# Patient Record
Sex: Female | Born: 1940 | Race: White | Hispanic: No | Marital: Married | Smoking: Former smoker
Health system: Southern US, Community
[De-identification: ages and names within clinical notes are randomized; demographics above are authoritative.]

## PROBLEM LIST (undated history)

## (undated) DIAGNOSIS — I6529 Occlusion and stenosis of unspecified carotid artery: Secondary | ICD-10-CM

## (undated) DIAGNOSIS — M1990 Unspecified osteoarthritis, unspecified site: Secondary | ICD-10-CM

## (undated) DIAGNOSIS — E785 Hyperlipidemia, unspecified: Secondary | ICD-10-CM

## (undated) DIAGNOSIS — I10 Essential (primary) hypertension: Secondary | ICD-10-CM

## (undated) HISTORY — DX: Occlusion and stenosis of unspecified carotid artery: I65.29

## (undated) HISTORY — DX: Unspecified osteoarthritis, unspecified site: M19.90

## (undated) HISTORY — DX: Hyperlipidemia, unspecified: E78.5

## (undated) HISTORY — DX: Essential (primary) hypertension: I10

---

## 2000-08-01 ENCOUNTER — Other Ambulatory Visit: Admission: RE | Admit: 2000-08-01 | Discharge: 2000-08-01 | Payer: Self-pay

## 2000-08-01 ENCOUNTER — Encounter: Admission: RE | Admit: 2000-08-01 | Discharge: 2000-08-01 | Payer: Self-pay

## 2006-12-09 ENCOUNTER — Ambulatory Visit: Payer: Self-pay | Admitting: Gastroenterology

## 2006-12-24 ENCOUNTER — Ambulatory Visit: Payer: Self-pay | Admitting: Gastroenterology

## 2006-12-24 ENCOUNTER — Encounter: Payer: Self-pay | Admitting: Gastroenterology

## 2008-06-24 ENCOUNTER — Ambulatory Visit: Payer: Self-pay | Admitting: Cardiovascular Disease

## 2008-07-05 ENCOUNTER — Ambulatory Visit: Payer: Self-pay

## 2008-07-05 ENCOUNTER — Encounter: Payer: Self-pay | Admitting: Cardiovascular Disease

## 2008-07-26 ENCOUNTER — Encounter: Payer: Self-pay | Admitting: Cardiovascular Disease

## 2008-07-26 ENCOUNTER — Ambulatory Visit: Payer: Self-pay | Admitting: Cardiovascular Disease

## 2008-07-26 DIAGNOSIS — E785 Hyperlipidemia, unspecified: Secondary | ICD-10-CM | POA: Insufficient documentation

## 2008-07-26 DIAGNOSIS — I10 Essential (primary) hypertension: Secondary | ICD-10-CM | POA: Insufficient documentation

## 2008-07-26 DIAGNOSIS — M1990 Unspecified osteoarthritis, unspecified site: Secondary | ICD-10-CM | POA: Insufficient documentation

## 2008-07-26 DIAGNOSIS — I6529 Occlusion and stenosis of unspecified carotid artery: Secondary | ICD-10-CM

## 2009-01-05 ENCOUNTER — Encounter: Payer: Self-pay | Admitting: Cardiovascular Disease

## 2009-01-05 ENCOUNTER — Ambulatory Visit: Payer: Self-pay

## 2009-03-01 ENCOUNTER — Encounter: Admission: RE | Admit: 2009-03-01 | Discharge: 2009-03-01 | Payer: Self-pay | Admitting: General Surgery

## 2009-03-01 ENCOUNTER — Encounter (INDEPENDENT_AMBULATORY_CARE_PROVIDER_SITE_OTHER): Payer: Self-pay | Admitting: Interventional Radiology

## 2009-03-01 ENCOUNTER — Other Ambulatory Visit: Admission: RE | Admit: 2009-03-01 | Discharge: 2009-03-01 | Payer: Self-pay | Admitting: Interventional Radiology

## 2009-03-01 IMAGING — US US BIOPSY
1 series · 11 of 11 positions shown · non-contrast
Comparison: Prior ultrasound at [facility] dated
[DATE]

CLINICAL DATA: Left thyroid nodules.

ULTRASOUND-GUIDED NEEDLE ASPIRATE BIOPSY, LEFT LOBE OF THYROID
The above procedure was discussed with the patient and written
informed consent was obtained.

[Series 1: us biopsy · 11 acquisitions, 11 frames shown]
[im 1/11]
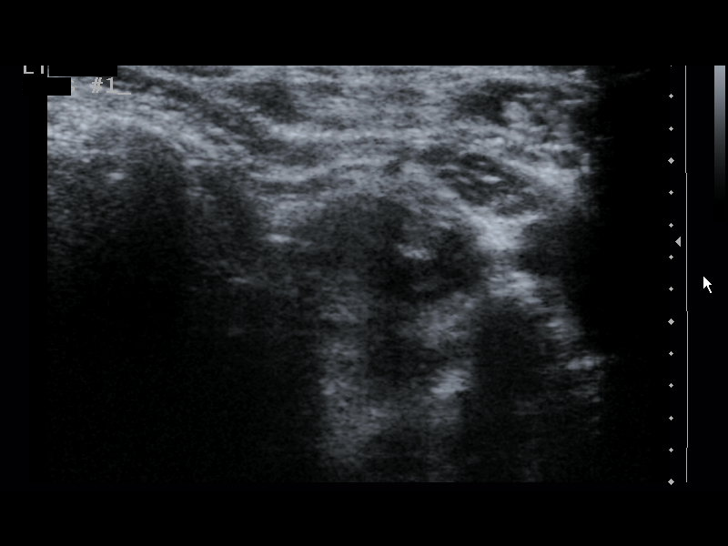
[im 2/11]
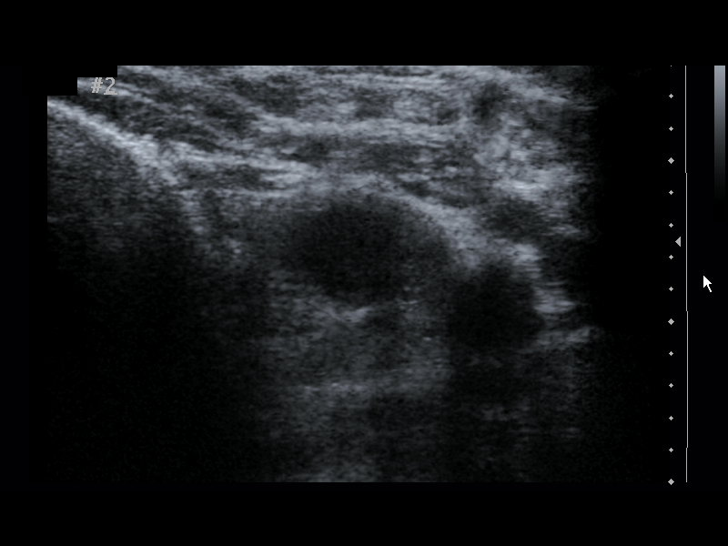
[im 3/11]
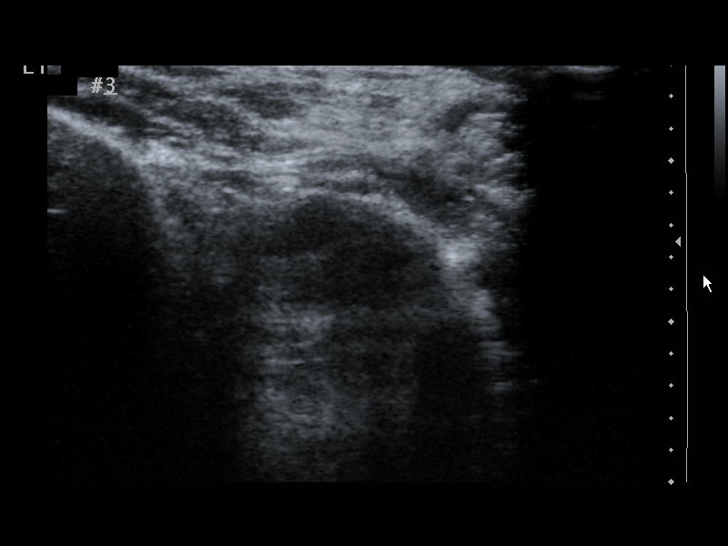
[im 4/11]
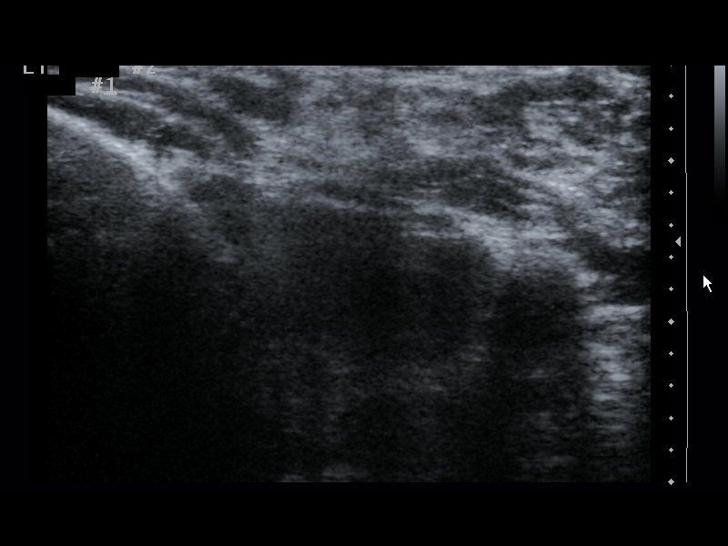
[im 5/11]
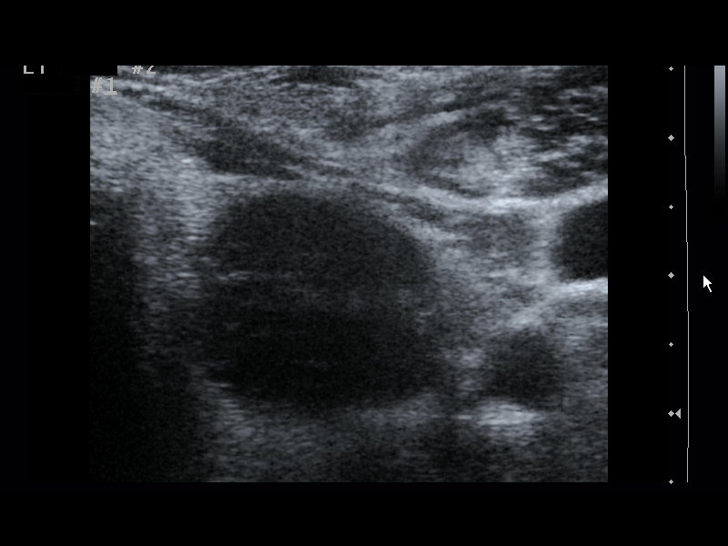
[im 6/11]
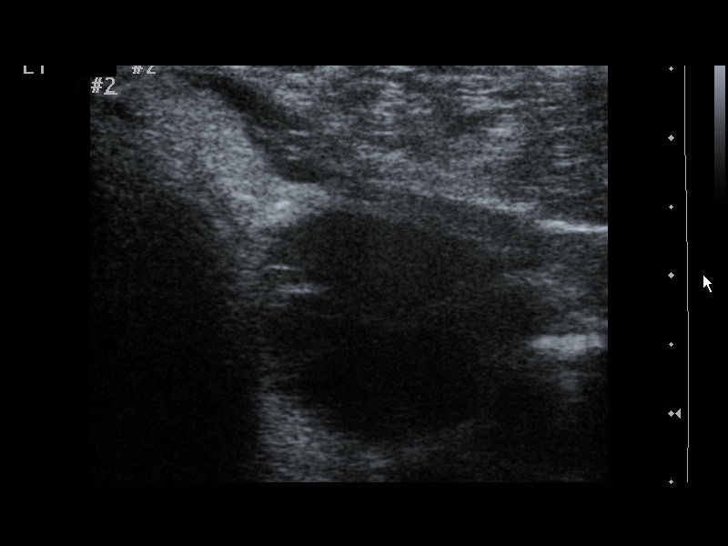
[im 7/11]
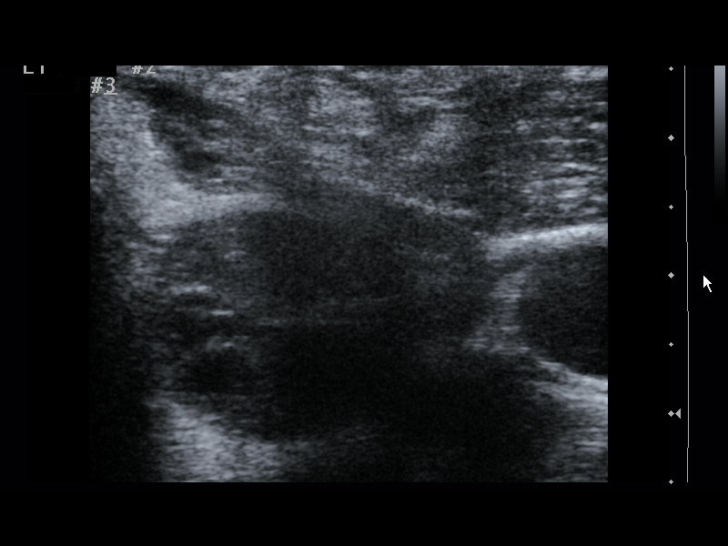
[im 8/11]
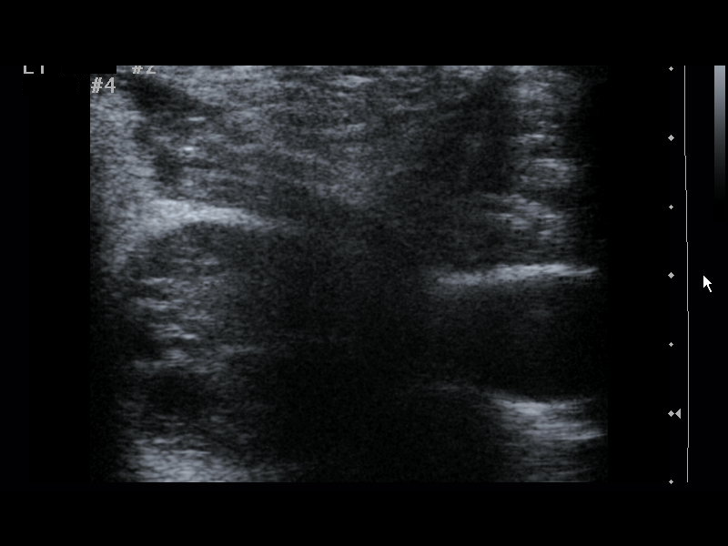
[im 9/11]
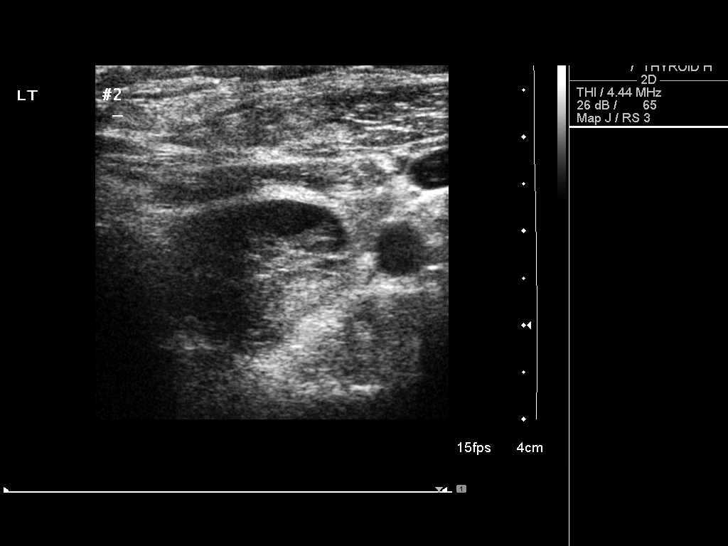
[im 10/11]
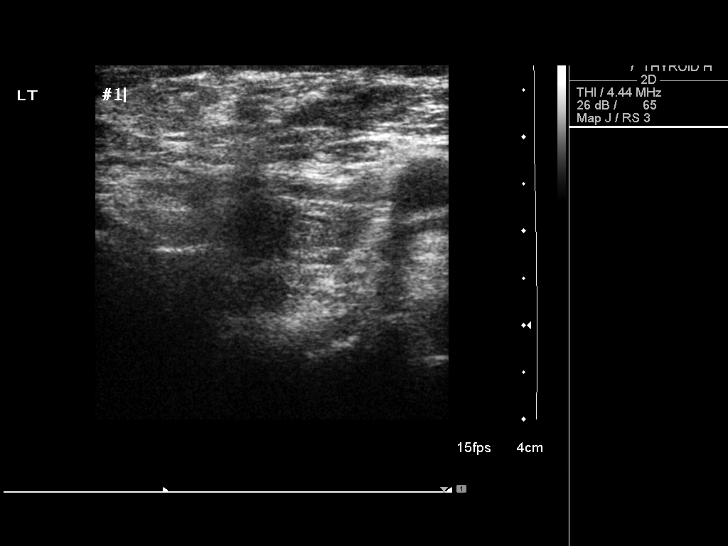
[im 11/11]
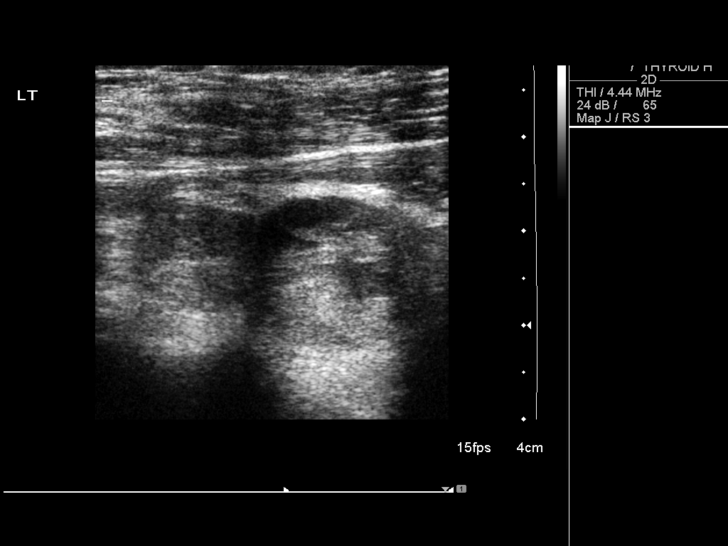

[11 of 11 positions shown; findings below may reference images not displayed]

FINDINGS: Ultrasound was performed to localize and mark an adequate
site for the biopsy.  The patient was then prepped and draped in a
normal sterile fashion.  Local anesthesia was provided with 1%
lidocaine.

Using direct ultrasound guidance, 4 passes were made using 25 gauge
needles into the nodule within the superior nodule in the left lobe
of the thyroid.

Additional biopsy was performed of the larger more inferior thyroid
nodule.  A total of four passes were made utilizing 25 gauge
needles under direct ultrasound guidance.

Ultrasound was used to confirm needle placements on all occasions.
Specimens were sent to Pathology for analysis.  Post procedural
imaging demonstrated no hematoma or immediate complication.  The
patient tolerated the procedure well.
IMPRESSION: Ultrasound guided needle aspirate biopsy of both nodules in the
left lobe of the thyroid.  These were clearly labeled as nodule
number one in the superior left lobe and nodule number two
immediately inferior to the superior nodule.

## 2009-03-06 ENCOUNTER — Ambulatory Visit: Payer: Self-pay | Admitting: Cardiovascular Disease

## 2009-07-04 ENCOUNTER — Encounter: Payer: Self-pay | Admitting: Cardiovascular Disease

## 2009-07-05 ENCOUNTER — Encounter: Payer: Self-pay | Admitting: Cardiovascular Disease

## 2009-07-05 ENCOUNTER — Ambulatory Visit: Payer: Self-pay

## 2010-01-17 ENCOUNTER — Encounter: Payer: Self-pay | Admitting: Cardiovascular Disease

## 2010-01-18 ENCOUNTER — Encounter: Payer: Self-pay | Admitting: Cardiovascular Disease

## 2010-01-18 ENCOUNTER — Ambulatory Visit: Payer: Self-pay

## 2010-06-22 ENCOUNTER — Encounter (INDEPENDENT_AMBULATORY_CARE_PROVIDER_SITE_OTHER): Payer: Self-pay | Admitting: *Deleted

## 2010-06-26 NOTE — Miscellaneous (Signed)
Summary: Orders Update  Clinical Lists Changes  Orders: Added new Test order of Carotid Duplex (Carotid Duplex) - Signed 

## 2010-06-28 NOTE — Letter (Signed)
Summary: Generic Letter  Architectural technologist, Main Office  [address]   [place], [place] [address]   Phone: [phone]  Fax: [phone]        June 22, 2010 MRN: [id number]    Joanne Montes [address] [place], [place]  [address]    Dear Ms. Wedin,    This letter is to inform you that we have schedule you for an appointment for a 6 month follow-up carotid study to be done on the same day as your appointment with Dr Excell Seltzer. We have also tried to contact you by phone with no success. All phone numbers in our system have been disconnected. Please call our office to update your phone numbers.3   The time and date for this test is listed as below.   Carotid ultrasound 3/29 @ 11:00 a.m Dr Copper 3/29 @ 12:00.   If you have any questions concerning this appointment please feel free to call our office at [phone] and press option 7 to speak with a memeber of the scheduling team.        Sincerely,  Joanne Montes  This letter has been electronically signed by your physician.

## 2010-08-11 ENCOUNTER — Encounter: Payer: Self-pay | Admitting: Cardiovascular Disease

## 2010-08-22 ENCOUNTER — Other Ambulatory Visit: Payer: Self-pay | Admitting: Cardiovascular Disease

## 2010-08-22 DIAGNOSIS — I6529 Occlusion and stenosis of unspecified carotid artery: Secondary | ICD-10-CM

## 2010-08-23 ENCOUNTER — Other Ambulatory Visit: Payer: Self-pay | Admitting: *Deleted

## 2010-08-23 ENCOUNTER — Encounter: Payer: Self-pay | Admitting: Cardiovascular Disease

## 2010-08-23 ENCOUNTER — Encounter (INDEPENDENT_AMBULATORY_CARE_PROVIDER_SITE_OTHER): Payer: Medicare Other | Admitting: Cardiology

## 2010-08-23 ENCOUNTER — Ambulatory Visit (INDEPENDENT_AMBULATORY_CARE_PROVIDER_SITE_OTHER): Payer: Medicare Other | Admitting: Cardiovascular Disease

## 2010-08-23 DIAGNOSIS — I6529 Occlusion and stenosis of unspecified carotid artery: Secondary | ICD-10-CM

## 2010-08-23 DIAGNOSIS — I10 Essential (primary) hypertension: Secondary | ICD-10-CM

## 2010-08-23 NOTE — Assessment & Plan Note (Signed)
Stable without neurologic symptoms. She is on an aggressive risk reduction program with aspirin, statin therapy, and good control of her blood pressure. We'll followup her carotid duplex today to reassess bilateral ICA stenosis. Her last study showed moderate 60-80% disease bilaterally. She was counseled regarding strokes symptoms. The patient will continue with yearly followup and every 6 months carotid duplex studies until we see stabilization of her carotid disease.

## 2010-08-23 NOTE — Patient Instructions (Signed)
Your physician recommends that you schedule a follow-up appointment in: 12 months with Dr Excell Seltzer.

## 2010-08-23 NOTE — Progress Notes (Signed)
HPI: This is a 70 year old woman presenting for followup of bilateral carotid stenosis. The patient has been asymptomatic and has no history of stroke or TIA.  She has been doing well since her last visit here. She specifically denies chest pain, dyspnea, or edema.  The patient denies numbness, tingling, or weakness of her extremities. She had no aphasia or episodes of amaurosis. She reports compliance with her medication program.  Outpatient Encounter Prescriptions as of 08/23/2010  Medication Sig Dispense Refill  . albuterol (PROVENTIL,VENTOLIN) 90 MCG/ACT inhaler Inhale 2 puffs into the lungs every 6 (six) hours as needed.        Marland Kitchen aspirin 81 MG tablet Take 81 mg by mouth daily.        . Calcium Carbonate-Vitamin D 600-400 MG-UNIT per tablet Take 1 tablet by mouth 2 (two) times daily.        . Diphenhydramine-APAP, sleep, (EXCEDRIN PM) 38-500 MG TABS Take by mouth daily.        Marland Kitchen ezetimibe-simvastatin (VYTORIN) 10-40 MG per tablet Take 1 tablet by mouth at bedtime.        . fish oil-omega-3 fatty acids 1000 MG capsule Take 2 g by mouth daily.        . hydrochlorothiazide 25 MG tablet Take 25 mg by mouth daily.        Marland Kitchen lisinopril-hydrochlorothiazide (PRINZIDE,ZESTORETIC) 20-25 MG per tablet Take 1 tablet by mouth daily.        . metoprolol (LOPRESSOR) 50 MG tablet Take 50 mg by mouth 2 (two) times daily.        . Misc Natural Products (OSTEO BI-FLEX ADV DOUBLE ST) TABS Take by mouth 2 (two) times daily.        Marland Kitchen pyridOXINE (VITAMIN B-6) 100 MG tablet Take 100 mg by mouth daily.        . Misc Natural Products (OSTEO BI-FLEX ADV JOINT SHIELD PO) Take by mouth.          Allergies  Allergen Reactions  . Guaifenesin     REACTION: cough  . Z-Pak (Azithromycin) Swelling    lips    Past Medical History  Diagnosis Date  . Osteoarthrosis, unspecified whether generalized or localized, unspecified site   . Other and unspecified hyperlipidemia   . Essential hypertension, benign   . Carotid  stenosis     ROS: Negative except as per HPI  BP 122/80  Pulse 60  Resp 18  Ht 5\' 3"  (1.6 m)  Wt 171 lb (77.565 kg)  BMI 30.29 kg/m2  PHYSICAL EXAM: Pt is alert and oriented, NAD HEENT: normal Neck: JVP - normal, carotids 2+= with bilateral carotid bruits Lungs: CTA bilaterally CV: RRR without murmur or gallop Abd: soft, NT, Positive BS, no hepatomegaly Ext: no C/C/E, distal pulses intact and equal Skin: warm/dry no rash  EKG: Normal sinus rhythm with sinus arrhythmia, otherwise within normal limits.  ASSESSMENT AND PLAN:

## 2010-08-30 ENCOUNTER — Encounter: Payer: Self-pay | Admitting: Cardiovascular Disease

## 2010-10-09 NOTE — Letter (Signed)
June 24, 2008    Tammy R. Collins [place], MD  [address] [place] [address]   RE:  SHALAMAR, PLOURDE  MRN:  [id number]  /  DOB:  1940-12-21   Dear Babette Relic:   It was my pleasure to see Abigayl Hor on January 29 for evaluation of  an abnormal EKG.   As you know, she is a delightful 70 year old woman with no history of  cardiac disease.  She has had a recent pneumonia and has been treated  with outpatient antibiotics.  She is recovering from that at present.  She underwent an echocardiogram in your office dated January 28.  The  pertinent findings from one of the EKGs showed sinus rhythm with left  axis deviation.  The other EKG showed an ectopic atrial rhythm with  abnormal P-wave morphology as well as left axis deviation.   The patient is completely asymptomatic.  She is active as she cares  regularly for her 105-year-old great grandchild.  She also does a lot of  yard work in the summertime.  She specifically denies chest pain,  dyspnea, orthopnea, PND, edema, palpitations, lightheadedness, or  syncope.  She has no history of cardiac problems.   PAST MEDICAL HISTORY:  1. Essential hypertension.  She had edema with her previous medicine,      I suspect Norvasc.  She is tolerating her current regimen well, and      denies any edema or other side effects.  She has had no history of      end-organ damage.  2. Dyslipidemia.  3. Osteoarthritis.  4. Bunionectomy, right foot in 2008.  5. Back surgery in the 1980s.  6. Remote appendectomy.   MEDICATIONS:  1. Metoprolol 25 mg b.i.d.  2. Lisinopril/HCTZ 20/25 mg daily.  3. Aspirin 81 mg daily,  4. Vytorin 10/40 mg daily.  5. Calcium 600 mg twice daily.  6. Osteo Bi-Flex twice daily.  7. Excedrin P.M. twice daily.   ALLERGIES:  AZITHROMYCIN and MUCINEX.   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  She is widowed.  She has 3 children.  She has a history of  tobacco use, but quit in 1999.   She does not drink alcohol or use any  illicit drugs.  She is not engaged in regular exercise, but leads an  active lifestyle.   FAMILY HISTORY:  The patient's mother died at age 64 with congestive  heart failure.  Her father died at age 35 of cancer.  She has 5 siblings  without heart problems.  She has 1 sibling with hypertension.   REVIEW OF SYSTEMS:  Complete 12-point review of systems was performed.  A recent bout with pneumonia as outlined above.  I think, she has mild  asthma as she develops wheezing when she gets a cold.  She is not on  regular therapy for asthma.  Also, bilateral knee pain related to  osteoarthritis.  All other systems were reviewed and are negative,  except as above.   PHYSICAL EXAMINATION:  GENERAL:  The patient is alert and oriented.  No  acute distress.  VITAL SIGNS:  Weight 170 pounds, blood pressure 130/78, heart rate 69,  and respiratory rate is 12.  HEENT:  Normal.  NECK:  Normal carotid upstrokes with bilateral bruits.  JVP normal.  No  thyromegaly or thyroid nodules.  LUNGS:  Clear bilaterally.  HEART:  The apex is discreet and nondisplaced. Heart, regular rate  and  rhythm.  There is no right ventricular heave or lift.  S1 and S2 are  normal.  No murmurs or gallops.  ABDOMEN:  Soft, obese, nontender, and no organomegaly.  No bruits.  Positive bowel sounds.  BACK:  No CVA tenderness.  EXTREMITIES:  No clubbing, cyanosis, or edema.  Peripheral pulses are  intact and equal.  SKIN:  Warm and dry without rash.  LYMPHATICS:  No adenopathy.  NEUROLOGIC:  Cranial nerves II-XII are intact.  Strength is intact and  equal.   EKG shows normal sinus rhythm with left axis deviation.  There is an  abnormal P-wave morphology with negative T-waves in II, III and aVF, as  well as negative P-waves in the anterolateral leads.   ASSESSMENT:  This is a 70 year old woman with no prior history of  cardiac disease, who has an abnormal EKG with left axis deviation  and  abnormal P-wave morphology.  This is in the setting of an asymptomatic  patient with background cardiovascular risk factors of hypertension and  dyslipidemia.  I have recommended a 2-D echocardiogram to rule out  structural heart disease.  There is no structural heart disease evident  on physical exam.  She also has asymptomatic bilateral carotid bruits  and I have recommended a carotid ultrasound to evaluate for significant  carotid stenosis.  I have a low suspicion of ischemic heart disease with  her lack of symptoms, and I do not think stress testing is necessary at  this point.   I will follow up after her initial studies are complete to make sure  that there are no other studies necessary.  With a lack of palpitations  or rhythm-related symptoms, I think she can be watched with serial EKGs  at her routine office visits.   Thanks again for the opportunity to see this very nice lady.  Please  feel free to call at any time with questions.  I will plan on seeing her  back in approximately 1 month after her echocardiogram and carotid  ultrasound are complete.    Sincerely,      Veverly Fells. Excell Seltzer, MD  Electronically Signed    MDC/MedQ  DD: 06/24/2008  DT: 06/25/2008  Job #: [id number]

## 2011-07-31 ENCOUNTER — Other Ambulatory Visit: Payer: Self-pay | Admitting: *Deleted

## 2011-07-31 DIAGNOSIS — I6529 Occlusion and stenosis of unspecified carotid artery: Secondary | ICD-10-CM

## 2011-08-01 ENCOUNTER — Encounter (INDEPENDENT_AMBULATORY_CARE_PROVIDER_SITE_OTHER): Payer: Medicare Other

## 2011-08-01 DIAGNOSIS — I6529 Occlusion and stenosis of unspecified carotid artery: Secondary | ICD-10-CM

## 2011-08-06 ENCOUNTER — Telehealth: Payer: Self-pay | Admitting: Cardiovascular Disease

## 2011-08-06 NOTE — Telephone Encounter (Signed)
Pt aware of carotid results by phone.  

## 2011-08-06 NOTE — Telephone Encounter (Signed)
Patient returning nurse call, she can be reached at hm# [phone]

## 2012-02-24 ENCOUNTER — Ambulatory Visit (INDEPENDENT_AMBULATORY_CARE_PROVIDER_SITE_OTHER): Payer: Medicare Other | Admitting: Cardiovascular Disease

## 2012-02-24 ENCOUNTER — Encounter: Payer: Self-pay | Admitting: Cardiovascular Disease

## 2012-02-24 VITALS — BP 158/87 | HR 59 | Ht 63.0 in | Wt 177.0 lb

## 2012-02-24 DIAGNOSIS — I6529 Occlusion and stenosis of unspecified carotid artery: Secondary | ICD-10-CM

## 2012-02-24 DIAGNOSIS — I10 Essential (primary) hypertension: Secondary | ICD-10-CM

## 2012-02-24 NOTE — Patient Instructions (Addendum)
Your physician wants you to follow-up in: 1 YEAR. You will receive a reminder letter in the mail two months in advance. If you don't receive a letter, please call our office to schedule the follow-up appointment.  Your physician has requested that you have a carotid duplex. This test is an ultrasound of the carotid arteries in your neck. It looks at blood flow through these arteries that supply the brain with blood. Allow one hour for this exam. There are no restrictions or special instructions.  Your physician has requested that you regularly monitor and record your blood pressure readings at home. Please use the same machine at the same time of day to check your readings and record them to bring to your follow-up visit. PLEASE CALL THE OFFICE IF YOUR BP IS CONSISTENTLY ABOVE 140/90.  Your physician discussed the importance of regular exercise and recommended that you start or continue a regular exercise program for good health.  Your physician recommends that you continue on your current medications as directed. Please refer to the Current Medication list given to you today.

## 2012-02-24 NOTE — Progress Notes (Signed)
HPI:  71 year old woman presenting for followup of bilateral carotid stenosis. The patient has been asymptomatic with no history of stroke or TIA. Her last carotid study was in March 2013. This demonstrated moderate heterogenous plaque in both carotid arteries with 60-79% bilateral internal carotid artery stenosis.  The patient feels fine. She has specifically had no stroke or TIA symptoms. She denies chest pain or pressure, edema, dyspnea, orthopnea, or PND.  She is not engaged in any routine exercise. She does remain physically active and does yard work. She can use a push mower and has no symptoms with that level of exertion.  Outpatient Encounter Prescriptions as of 02/24/2012  Medication Sig Dispense Refill  . albuterol (PROVENTIL,VENTOLIN) 90 MCG/ACT inhaler Inhale 2 puffs into the lungs every 6 (six) hours as needed.        Marland Kitchen aspirin 81 MG tablet Take 81 mg by mouth daily.        . Calcium Carbonate-Vitamin D 600-400 MG-UNIT per tablet Take 1 tablet by mouth 2 (two) times daily.        . Diphenhydramine-APAP, sleep, (EXCEDRIN PM) 38-500 MG TABS Take by mouth daily.        Marland Kitchen ezetimibe-simvastatin (VYTORIN) 10-40 MG per tablet Take 1 tablet by mouth at bedtime.        . fish oil-omega-3 fatty acids 1000 MG capsule Take 2 g by mouth daily.      Marland Kitchen glucosamine-chondroitin 500-400 MG tablet Take 1 tablet by mouth 2 (two) times daily.      Marland Kitchen ibuprofen (ADVIL,MOTRIN) 200 MG tablet Take 200 mg by mouth every 6 (six) hours as needed.      Marland Kitchen lisinopril-hydrochlorothiazide (PRINZIDE,ZESTORETIC) 20-25 MG per tablet Take 1 tablet by mouth daily.        . Melatonin 5 MG CAPS Take by mouth. 1 tab daily      . metoprolol (LOPRESSOR) 50 MG tablet Take 50 mg by mouth 2 (two) times daily.       . Multiple Vitamin (MULTIVITAMIN) tablet Take 1 tablet by mouth daily.      Marland Kitchen DISCONTD: fish oil-omega-3 fatty acids 1000 MG capsule Take 2 g by mouth daily.        Marland Kitchen DISCONTD: hydrochlorothiazide 25 MG tablet Take  25 mg by mouth daily.        Marland Kitchen DISCONTD: Misc Natural Products (OSTEO BI-FLEX ADV DOUBLE ST) TABS Take by mouth 2 (two) times daily.        Marland Kitchen DISCONTD: Misc Natural Products (OSTEO BI-FLEX ADV JOINT SHIELD PO) Take by mouth.        . DISCONTD: pyridOXINE (VITAMIN B-6) 100 MG tablet Take 100 mg by mouth daily.          Allergies  Allergen Reactions  . Guaifenesin     REACTION: cough  . Z-Pak (Azithromycin) Swelling    lips    Past Medical History  Diagnosis Date  . Osteoarthrosis, unspecified whether generalized or localized, unspecified site   . Other and unspecified hyperlipidemia   . Essential hypertension, benign   . Carotid stenosis     ROS: Negative except as per HPI  BP 158/87  Pulse 59  Ht 5\' 3"  (1.6 m)  Wt 80.287 kg (177 lb)  BMI 31.35 kg/m2  PHYSICAL EXAM: Pt is alert and oriented, NAD HEENT: normal Neck: JVP - normal, carotids 2+= with soft bilateral bruits Lungs: CTA bilaterally CV: RRR without murmur or gallop Abd: soft, NT, Positive BS, no hepatomegaly Ext: no  C/C/E, distal pulses intact and equal Skin: warm/dry no rash  EKG: Normal sinus rhythm 59 beats per minute, abnormal P wave axis, otherwise within normal limits.  ASSESSMENT AND PLAN: 1. Carotid atherosclerosis, bilateral. The patient remains asymptomatic. She is on a reasonable medical program with aspirin for antiplatelet therapy, ezetimibe/simvastatin for lipid lowering, and a combination of lisinopril/hydrochlorothiazide/metoprolol for blood pressure. She is due for a repeat carotid duplex in this will be arranged.  2. Hypertension. I repeated the patient's blood pressure and it was elevated today in the same range (160/80). She tells me that her other readings have been within normal limits. I encouraged her to record home blood pressure readings and call us back if they are running greater than 140/90. For now she will continue her same medical program.  3. Hyperlipidemia. The patient is  followed by Dr. Collins Scotland. She is on appropriate medication.  We discussed the importance of initiating a regular exercise program where she achieves 30-40 minutes of aerobic exercise 4-5 days weekly. I will see her back in 12 months for followup. We will call her after the results of her carotid duplex are available and she will notify asked if her blood pressures are elevated at home.  Tonny Bollman 02/24/2012 10:28 AM

## 2012-03-10 ENCOUNTER — Encounter (INDEPENDENT_AMBULATORY_CARE_PROVIDER_SITE_OTHER): Payer: Medicare Other

## 2012-03-10 DIAGNOSIS — I6529 Occlusion and stenosis of unspecified carotid artery: Secondary | ICD-10-CM

## 2012-03-10 DIAGNOSIS — I10 Essential (primary) hypertension: Secondary | ICD-10-CM

## 2012-03-17 ENCOUNTER — Telehealth: Payer: Self-pay | Admitting: Cardiovascular Disease

## 2012-03-17 NOTE — Telephone Encounter (Signed)
PT AWARE OF CAROTID RESULTS ./CY 

## 2012-03-17 NOTE — Telephone Encounter (Signed)
Pt returning nurse call , she can be reached at [phone]

## 2012-10-07 ENCOUNTER — Encounter (INDEPENDENT_AMBULATORY_CARE_PROVIDER_SITE_OTHER): Payer: Medicare Other

## 2012-10-07 DIAGNOSIS — I6529 Occlusion and stenosis of unspecified carotid artery: Secondary | ICD-10-CM

## 2012-10-22 ENCOUNTER — Encounter: Payer: Self-pay | Admitting: Cardiovascular Disease

## 2012-10-22 NOTE — Telephone Encounter (Signed)
Follow up  ° ° ° °Returning call back to nurse  °

## 2012-10-22 NOTE — Telephone Encounter (Signed)
New Prob ° ° ° ° ° °Pt calling in following up on test results. Please call. °

## 2012-10-22 NOTE — Telephone Encounter (Signed)
Left message on machine for pt to contact the office.   

## 2012-10-22 NOTE — Telephone Encounter (Signed)
This encounter was created in error - please disregard.

## 2013-03-17 ENCOUNTER — Encounter: Payer: Self-pay | Admitting: Cardiovascular Disease

## 2013-03-17 ENCOUNTER — Ambulatory Visit (INDEPENDENT_AMBULATORY_CARE_PROVIDER_SITE_OTHER): Payer: Medicare Other | Admitting: Cardiovascular Disease

## 2013-03-17 VITALS — BP 140/78 | HR 64 | Ht 63.0 in | Wt 178.0 lb

## 2013-03-17 DIAGNOSIS — I6529 Occlusion and stenosis of unspecified carotid artery: Secondary | ICD-10-CM

## 2013-03-17 DIAGNOSIS — E785 Hyperlipidemia, unspecified: Secondary | ICD-10-CM

## 2013-03-17 DIAGNOSIS — I10 Essential (primary) hypertension: Secondary | ICD-10-CM

## 2013-03-17 NOTE — Patient Instructions (Signed)
Your physician has requested that you have a carotid duplex in MAY 2015. This test is an ultrasound of the carotid arteries in your neck. It looks at blood flow through these arteries that supply the brain with blood. Allow one hour for this exam. There are no restrictions or special instructions.  Your physician recommends that you continue on your current medications as directed. Please refer to the Current Medication list given to you today.  Your physician wants you to follow-up in: 1 YEAR with Dr Excell Seltzer.  You will receive a reminder letter in the mail two months in advance. If you don't receive a letter, please call our office to schedule the follow-up appointment.

## 2013-03-17 NOTE — Progress Notes (Signed)
HPI:  This is a 72 year old woman presenting for followup evaluation. She has carotid stenosis with no history of stroke or TIA. She was last seen approximately one year ago. Her most recent carotid ultrasound from May 2014 showed 60-79% bilateral ICA stenosis which was stable over serial exams and one year followup was recommended.  The patient is doing well. She has had no stroke or TIA symptoms. She specifically denies numbness/weakness/tingling of her extremities. She denies amaurosis or expressive aphasia. She has been educated on stroke symptoms. She's had no chest pain, dyspnea, or palpitations.  Outpatient Encounter Prescriptions as of 03/17/2013  Medication Sig Dispense Refill  . albuterol (PROVENTIL,VENTOLIN) 90 MCG/ACT inhaler Inhale 2 puffs into the lungs every 6 (six) hours as needed.        Marland Kitchen aspirin 81 MG tablet Take 81 mg by mouth daily.        . Calcium Carbonate-Vitamin D 600-400 MG-UNIT per tablet Take 1 tablet by mouth 2 (two) times daily.        . Diphenhydramine-APAP, sleep, (EXCEDRIN PM) 38-500 MG TABS Take by mouth daily.        Marland Kitchen ezetimibe-simvastatin (VYTORIN) 10-40 MG per tablet Take 1 tablet by mouth at bedtime.        . fish oil-omega-3 fatty acids 1000 MG capsule Take 2 g by mouth daily.      Marland Kitchen glucosamine-chondroitin 500-400 MG tablet Take 1 tablet by mouth 2 (two) times daily.      . hydrochlorothiazide (HYDRODIURIL) 25 MG tablet       . ibuprofen (ADVIL,MOTRIN) 200 MG tablet Take 200 mg by mouth every 6 (six) hours as needed.      Marland Kitchen lisinopril (PRINIVIL,ZESTRIL) 40 MG tablet       . Melatonin 5 MG CAPS Take by mouth. 1 tab daily      . metoprolol (LOPRESSOR) 50 MG tablet Take 50 mg by mouth 2 (two) times daily.       . Multiple Vitamin (MULTIVITAMIN) tablet Take 1 tablet by mouth daily.      Marland Kitchen PROAIR HFA 108 (90 BASE) MCG/ACT inhaler       . [DISCONTINUED] metoprolol succinate (TOPROL-XL) 50 MG 24 hr tablet       . [DISCONTINUED]  lisinopril-hydrochlorothiazide (PRINZIDE,ZESTORETIC) 20-25 MG per tablet Take 1 tablet by mouth daily.         No facility-administered encounter medications on file as of 03/17/2013.    Allergies  Allergen Reactions  . Guaifenesin     REACTION: cough  . Z-Pak [Azithromycin] Swelling    lips    Past Medical History  Diagnosis Date  . Osteoarthrosis, unspecified whether generalized or localized, unspecified site   . Other and unspecified hyperlipidemia   . Essential hypertension, benign   . Carotid stenosis     ROS: Negative except as per HPI  BP 140/78  Pulse 64  Ht 5\' 3"  (1.6 m)  Wt 178 lb (80.74 kg)  BMI 31.54 kg/m2  PHYSICAL EXAM: Pt is alert and oriented, NAD HEENT: normal Neck: JVP - normal, carotids 2+= with soft bilateral bruits Lungs: CTA bilaterally CV: RRR without murmur or gallop Abd: soft, NT, Positive BS, no hepatomegaly Ext: no C/C/E, distal pulses intact and equal Skin: warm/dry no rash  EKG:  Normal sinus rhythm 64 beats per minute, incomplete right bundle branch block.  ASSESSMENT AND PLAN: 1. Bilateral carotid artery stenosis without history of stroke or TIA. The patient is on a good medical program with  aspirin for antiplatelet therapy, ezetemibe/simvastatin for lipid lowering, and lisinopril/hydrochlorothiazide/metoprolol for treatment of hypertension. She has been well educated on symptoms of stroke or TIA and understands to call 911 immediately if these occur. She will be due for a repeat carotid duplex scan in May 2015 at a one-year interval from her previous study. I will see her back in one year for followup.  2. Essential hypertension. Patient's blood pressure is controlled on her current medical program.  3. Hyperlipidemia. On appropriate lipid-lowering therapy and followed by Dr. Collins Scotland.  Tonny Bollman 03/17/2013 9:18 AM

## 2013-10-15 ENCOUNTER — Ambulatory Visit (HOSPITAL_COMMUNITY): Payer: Medicare HMO | Attending: Cardiovascular Disease | Admitting: Cardiology

## 2013-10-15 DIAGNOSIS — I658 Occlusion and stenosis of other precerebral arteries: Secondary | ICD-10-CM | POA: Insufficient documentation

## 2013-10-15 DIAGNOSIS — I10 Essential (primary) hypertension: Secondary | ICD-10-CM

## 2013-10-15 DIAGNOSIS — E785 Hyperlipidemia, unspecified: Secondary | ICD-10-CM | POA: Insufficient documentation

## 2013-10-15 DIAGNOSIS — I6529 Occlusion and stenosis of unspecified carotid artery: Secondary | ICD-10-CM | POA: Insufficient documentation

## 2013-10-15 NOTE — Progress Notes (Signed)
Carotid duplex performed 

## 2014-03-17 ENCOUNTER — Ambulatory Visit (INDEPENDENT_AMBULATORY_CARE_PROVIDER_SITE_OTHER): Payer: Medicare HMO | Admitting: Cardiovascular Disease

## 2014-03-17 ENCOUNTER — Encounter: Payer: Self-pay | Admitting: Cardiovascular Disease

## 2014-03-17 VITALS — BP 148/82 | HR 76 | Ht 63.0 in | Wt 174.0 lb

## 2014-03-17 DIAGNOSIS — I10 Essential (primary) hypertension: Secondary | ICD-10-CM

## 2014-03-17 DIAGNOSIS — E785 Hyperlipidemia, unspecified: Secondary | ICD-10-CM

## 2014-03-17 NOTE — Progress Notes (Signed)
Background: The patient is followed for carotid stenosis with no history of stroke, hypertension, and hyperlipidemia which has been managed by her primary physician.  HPI:  73 year old woman presenting for followup evaluation. She is doing well with no complaints of chest pain, chest pressure, shortness of breath, leg swelling, lightheadedness, or presyncope. She has not been monitoring her blood pressure regularly at home. Lipids have been followed by her primary physician. She would like to switch from Vytorin to another medication because of cost.  Studies: Carotid duplex scan in May 2015 showed 60-79% ICA stenosis on the right and 40-59% ICA stenosis on the left.  Outpatient Encounter Prescriptions as of 03/17/2014  Medication Sig  . albuterol (PROVENTIL,VENTOLIN) 90 MCG/ACT inhaler Inhale 2 puffs into the lungs every 6 (six) hours as needed.    Marland Kitchen. aspirin 81 MG tablet Take 81 mg by mouth daily.    . Calcium Carbonate-Vitamin D 600-400 MG-UNIT per tablet Take 1 tablet by mouth 2 (two) times daily.    Marland Kitchen. ezetimibe-simvastatin (VYTORIN) 10-40 MG per tablet Take 1 tablet by mouth at bedtime.    . fish oil-omega-3 fatty acids 1000 MG capsule Take 2 g by mouth daily.  Marland Kitchen. glucosamine-chondroitin 500-400 MG tablet Take 1 tablet by mouth 2 (two) times daily.  . hydrochlorothiazide (HYDRODIURIL) 25 MG tablet Take 25 mg by mouth daily.   Marland Kitchen. ibuprofen (ADVIL,MOTRIN) 200 MG tablet Take 200 mg by mouth every 6 (six) hours as needed.  Marland Kitchen. lisinopril (PRINIVIL,ZESTRIL) 40 MG tablet Take 40 mg by mouth daily.   . Melatonin 5 MG CAPS Take by mouth. 1 tab daily  . metoprolol succinate (TOPROL-XL) 50 MG 24 hr tablet Take 50 mg by mouth 2 (two) times daily at 8 am and 10 pm.   . Multiple Vitamin (MULTIVITAMIN) tablet Take 1 tablet by mouth daily.  Marland Kitchen. PROAIR HFA 108 (90 BASE) MCG/ACT inhaler   . [DISCONTINUED] Diphenhydramine-APAP, sleep, (EXCEDRIN PM) 38-500 MG TABS Take by mouth daily.    . [DISCONTINUED]  metoprolol (LOPRESSOR) 50 MG tablet Take 50 mg by mouth 2 (two) times daily.     Allergies  Allergen Reactions  . Guaifenesin     REACTION: cough  . Z-Pak [Azithromycin] Swelling    lips    Past Medical History  Diagnosis Date  . Osteoarthrosis, unspecified whether generalized or localized, unspecified site   . Other and unspecified hyperlipidemia   . Essential hypertension, benign   . Carotid stenosis     family history includes Cancer in her father; Heart failure in her mother; Hypertension in her other.   ROS: Negative except as per HPI  BP 148/82  Pulse 76  Ht 5\' 3"  (1.6 m)  Wt 174 lb (78.926 kg)  BMI 30.83 kg/m2  PHYSICAL EXAM: Pt is alert and oriented, NAD HEENT: normal Neck: JVP - normal, carotids 2+= with bilateral bruits Lungs: CTA bilaterally CV: RRR without murmur or gallop Abd: soft, NT, Positive BS, no hepatomegaly Ext: no C/C/E, distal pulses intact and equal Skin: warm/dry no rash  EKG:  Normal sinus rhythm 76 beats per minute, within normal limits.  ASSESSMENT AND PLAN: 1. Bilateral carotid stenosis with no history of stroke or TIA. The patient remains asymptomatic. She continues on aspirin 81 mg, lipid-lowering with a statin drug, and an ACE inhibitor. Repeat carotid duplex scan at one year intervals. I will see her back in followup in one year.   2. Hypertension with suboptimal control. Advised and instructed on home blood pressure  monitoring. She will call in with readings if they are greater than 140/90. For now we'll continue on a combination of hydrochlorothiazide, lisinopril, and metoprolol succinate.  3. Hyperlipidemia. Upcoming followup with her primary physician who follows her labs. Would be reasonable to switch to atorvastatin as a generic alternative to Vytorin. Will defer to primary care.  Tonny BollmanMichael Rhen Kawecki MD 03/17/2014 4:07 PM

## 2014-03-17 NOTE — Patient Instructions (Signed)
Your physician has requested that you regularly monitor and record your blood pressure readings at home. Please use the same machine at the same time of day to check your readings and record them to bring to your follow-up visit. Please call the office in 2-3 weeks if you BP is consistently greater than 140/90.   Your physician recommends that you continue on your current medications as directed. Please refer to the Current Medication list given to you today. Per Dr Excell Seltzerooper it would be reasonable to switch your cholesterol medication from Vytorin to Atorvastatin due to cost.   Your physician wants you to follow-up in: 1 YEAR with Dr Excell Seltzerooper.  You will receive a reminder letter in the mail two months in advance. If you don't receive a letter, please call our office to schedule the follow-up appointment.

## 2015-01-04 ENCOUNTER — Encounter: Payer: Self-pay | Admitting: Gastroenterology

## 2015-02-10 ENCOUNTER — Encounter: Payer: Self-pay | Admitting: Cardiovascular Disease

## 2015-03-22 ENCOUNTER — Ambulatory Visit (INDEPENDENT_AMBULATORY_CARE_PROVIDER_SITE_OTHER): Payer: Medicare HMO | Admitting: Cardiovascular Disease

## 2015-03-22 ENCOUNTER — Encounter: Payer: Self-pay | Admitting: Cardiovascular Disease

## 2015-03-22 VITALS — BP 140/82 | HR 67 | Ht 63.0 in | Wt 169.0 lb

## 2015-03-22 DIAGNOSIS — E785 Hyperlipidemia, unspecified: Secondary | ICD-10-CM | POA: Diagnosis not present

## 2015-03-22 DIAGNOSIS — I10 Essential (primary) hypertension: Secondary | ICD-10-CM | POA: Diagnosis not present

## 2015-03-22 NOTE — Patient Instructions (Signed)

## 2015-03-22 NOTE — Progress Notes (Signed)
Cardiology Office Note Date:  03/22/2015   ID:  Sylvan CheeseMarjorie W Montes, DOB November 13, 1940, MRN [id number]  PCP:  Delorse LekBURNETT,BRENT A, MD  Cardiologist:  Tonny Bollmanooper, Kazimierz Springborn, MD    Chief Complaint  Patient presents with  . Follow-up    carotid stenosis   History of Present Illness: Joanne Montes is a 74 y.o. female who presents for follow-up evaluation.   The patient is followed for carotid stenosis, hypertension, and hyperlipidemia. She is currently doing well. She denies chest pain, chest pressure, shortness of breath, leg swelling, or heart palpitations. Lipids are followed by her primary care physician. She has switched from Vytorin to atorvastatin in the last year because of cost. Her last carotid study in May 2015 showed 60-79% ICA stenosis on the right and 40-59% ICA stenosis on the left.   She's had no stroke or TIA symptoms.   Past Medical History  Diagnosis Date  . Osteoarthrosis, unspecified whether generalized or localized, unspecified site   . Other and unspecified hyperlipidemia   . Essential hypertension, benign   . Carotid stenosis     History reviewed. No pertinent past surgical history.  Current Outpatient Prescriptions  Medication Sig Dispense Refill  . albuterol (PROVENTIL,VENTOLIN) 90 MCG/ACT inhaler Inhale 2 puffs into the lungs every 6 (six) hours as needed.      Marland Kitchen. aspirin 81 MG tablet Take 81 mg by mouth daily.      Marland Kitchen. atorvastatin (LIPITOR) 20 MG tablet Take 20 mg by mouth daily.    . Calcium Carbonate-Vitamin D 600-400 MG-UNIT per tablet Take 1 tablet by mouth 2 (two) times daily.      . fish oil-omega-3 fatty acids 1000 MG capsule Take 2 g by mouth daily.    Marland Kitchen. glucosamine-chondroitin 500-400 MG tablet Take 1 tablet by mouth 2 (two) times daily.    . hydrochlorothiazide (HYDRODIURIL) 25 MG tablet Take 25 mg by mouth daily.     Marland Kitchen. ibuprofen (ADVIL,MOTRIN) 200 MG tablet Take 200 mg by mouth every 6 (six) hours as needed.    Marland Kitchen. lisinopril (PRINIVIL,ZESTRIL) 40 MG  tablet Take 40 mg by mouth daily.     . Melatonin 5 MG CAPS Take 5 mg by mouth daily.     . metoprolol succinate (TOPROL-XL) 50 MG 24 hr tablet Take 50 mg by mouth 2 (two) times daily at 8 am and 10 pm.     . Multiple Vitamin (MULTIVITAMIN) tablet Take 1 tablet by mouth daily.    Marland Kitchen. PROAIR HFA 108 (90 BASE) MCG/ACT inhaler      No current facility-administered medications for this visit.    Allergies:   Guaifenesin and Z-pak   Social History:  The patient  reports that she has quit smoking. She does not have any smokeless tobacco history on file. She reports that she does not drink alcohol or use illicit drugs.   Family History:  The patient's family history includes Cancer in her father; Heart failure in her mother; Hypertension in her other.    ROS:  Please see the history of present illness. All other systems are reviewed and negative.    PHYSICAL EXAM: VS:  BP 140/82 mmHg  Pulse 67  Ht 5\' 3"  (1.6 m)  Wt 169 lb (76.658 kg)  BMI 29.94 kg/m2 , BMI Body mass index is 29.94 kg/(m^2). GEN: Well nourished, well developed, in no acute distress HEENT: normal Neck: no JVD, no masses.  Bilateral carotid bruits Cardiac: RRR without murmur or gallop  Respiratory:  clear to auscultation bilaterally, normal work of breathing GI: soft, nontender, nondistended, + BS MS: no deformity or atrophy Ext: no pretibial edema, pedal pulses 2+= bilaterally Skin: warm and dry, no rash Neuro:  Strength and sensation are intact Psych: euthymic mood, full affect  EKG:  EKG is ordered today. The ekg ordered today shows   Abnormal P wave axis possible ectopic atrial rhythm 67 bpm , otherwise within normal limits  Recent Labs: No results found for requested labs within last 365 days.   Lipid Panel  No results found for: CHOL, TRIG, HDL, CHOLHDL, VLDL, LDLCALC, LDLDIRECT    Wt Readings from Last 3 Encounters:  03/22/15 169 lb (76.658 kg)  03/17/14 174 lb (78.926 kg)  03/17/13 178 lb  (80.74 kg)    ASSESSMENT AND PLAN: 1.   Carotid stenosis without history of stroke: The patient has an upcoming carotid duplex scan. She continues on an excellent medical regimen with aspirin, a statin drug, and ACE inhibitor, and the beta blocker. She is educated on stroke/TIA symptoms. She will follow-up in one year.  2. Essential hypertension: Blood pressure is controlled on current medical therapy which is reviewed today.  3. Hyperlipidemia: The patient is treated with atorvastatin. She is going to establish with Dr. Doristine Counter in the future and will have lipids drawn through his office.  Current medicines are reviewed with the patient today.  The patient does not have concerns regarding medicines.  Labs/ tests ordered today include:  No orders of the defined types were placed in this encounter.    Disposition:   FU one year  Signed, Tonny Bollman, MD  03/22/2015 2:26 PM    [facility] [address] [place], [place], [place]  [address] Phone: [phone]; Fax: [phone]

## 2015-03-27 ENCOUNTER — Other Ambulatory Visit: Payer: Self-pay | Admitting: Cardiovascular Disease

## 2015-03-27 DIAGNOSIS — I6523 Occlusion and stenosis of bilateral carotid arteries: Secondary | ICD-10-CM

## 2015-04-03 ENCOUNTER — Ambulatory Visit (HOSPITAL_COMMUNITY)
Admission: RE | Admit: 2015-04-03 | Discharge: 2015-04-03 | Disposition: A | Discharge status: Home / Self Care | Payer: Medicare HMO | Source: Ambulatory Visit | Attending: Cardiovascular Disease | Admitting: Cardiovascular Disease

## 2015-04-03 DIAGNOSIS — I6523 Occlusion and stenosis of bilateral carotid arteries: Secondary | ICD-10-CM | POA: Diagnosis present

## 2015-04-03 DIAGNOSIS — E785 Hyperlipidemia, unspecified: Secondary | ICD-10-CM | POA: Insufficient documentation

## 2015-04-03 DIAGNOSIS — R938 Abnormal findings on diagnostic imaging of other specified body structures: Secondary | ICD-10-CM | POA: Insufficient documentation

## 2015-04-03 DIAGNOSIS — I10 Essential (primary) hypertension: Secondary | ICD-10-CM | POA: Diagnosis not present

## 2015-04-28 ENCOUNTER — Ambulatory Visit: Payer: Medicare HMO | Admitting: Cardiovascular Disease

## 2015-06-07 ENCOUNTER — Ambulatory Visit: Payer: Medicare HMO | Admitting: Cardiovascular Disease

## 2016-03-14 ENCOUNTER — Encounter: Payer: Self-pay | Admitting: Cardiology

## 2016-03-21 NOTE — Progress Notes (Signed)
Cardiology Office Note   Date:  03/22/2016   ID:  Joanne, Montes 07/19/1940, MRN [id number]  PCP:  Delorse Lek, MD  Cardiologist:  Dr. Excell Seltzer    Chief Complaint  Patient presents with  . Hypertension      History of Present Illness: Joanne Montes is a 75 y.o. female who presents for HTN.   The patient is followed for carotid stenosis, hypertension, and hyperlipidemia. She is currently doing well. She denies chest pain, chest pressure, shortness of breath, leg swelling, or heart palpitations. Lipids are followed by her primary care physician. She has switched from Vytorin to atorvastatin in the last year because of cost. Her carotid study in May 2015 showed 60-79% ICA stenosis on the right and 40-59% ICA stenosis on the left.  In 2016 RICA with 60-79%  LICA 40-59%.  She's had no stroke or TIA symptoms.  BP is mildly elevated today.   PCP follows her cholesterol.     Past Medical History:  Diagnosis Date  . Carotid stenosis   . Essential hypertension, benign   . Osteoarthrosis, unspecified whether generalized or localized, unspecified site   . Other and unspecified hyperlipidemia     History reviewed. No pertinent surgical history.   Current Outpatient Prescriptions  Medication Sig Dispense Refill  . albuterol (PROVENTIL,VENTOLIN) 90 MCG/ACT inhaler Inhale 2 puffs into the lungs every 6 (six) hours as needed.      Marland Kitchen aspirin 81 MG tablet Take 81 mg by mouth daily.      Marland Kitchen atorvastatin (LIPITOR) 20 MG tablet Take 20 mg by mouth daily.    . Calcium Carbonate-Vitamin D 600-400 MG-UNIT per tablet Take 1 tablet by mouth 2 (two) times daily.      . fish oil-omega-3 fatty acids 1000 MG capsule Take 2 g by mouth daily.    Marland Kitchen glucosamine-chondroitin 500-400 MG tablet Take 1 tablet by mouth 2 (two) times daily.    . hydrochlorothiazide (HYDRODIURIL) 25 MG tablet Take 25 mg by mouth daily.     Marland Kitchen ibuprofen (ADVIL,MOTRIN) 200 MG tablet Take 200 mg by mouth every 6 (six)  hours as needed (for pain).     Marland Kitchen lisinopril (PRINIVIL,ZESTRIL) 40 MG tablet Take 40 mg by mouth daily.     . Melatonin 5 MG CAPS Take 5 mg by mouth daily.     . metoprolol succinate (TOPROL-XL) 50 MG 24 hr tablet Take 50 mg by mouth 2 (two) times daily at 8 am and 10 pm.     . Multiple Vitamin (MULTIVITAMIN) tablet Take 1 tablet by mouth daily.     No current facility-administered medications for this visit.     Allergies:   Guaifenesin and Z-pak [azithromycin]    Social History:  The patient  reports that she has quit smoking. She has never used smokeless tobacco. She reports that she does not drink alcohol or use drugs.   Family History:  The patient's family history includes Cancer in her father; Heart failure in her mother; Hypertension in her other.    ROS:  General:no colds or fevers, no weight changes Skin:no rashes or ulcers HEENT:no blurred vision, no congestion CV:see HPI PUL:see HPI GI:no diarrhea constipation or melena, no indigestion GU:no hematuria, no dysuria MS:no joint pain, no claudication Neuro:no syncope, no lightheadedness Endo:no diabetes, no thyroid disease  Wt Readings from Last 3 Encounters:  03/22/16 170 lb (77.1 kg)  03/22/15 169 lb (76.7 kg)  03/17/14 174 lb (78.9 kg)  PHYSICAL EXAM: VS:  BP (!) 150/78   Pulse 70   Ht 5\' 3"  (1.6 m)   Wt 170 lb (77.1 kg)   SpO2 97%   BMI 30.11 kg/m  , BMI Body mass index is 30.11 kg/m. General:Pleasant affect, NAD Skin:Warm and dry, brisk capillary refill HEENT:normocephalic, sclera clear, mucus membranes moist Neck:supple, no JVD, no bruits  Heart:S1S2 RRR without murmur, gallup, rub or click Lungs:clear without rales, rhonchi, or wheezes WUJ:WJXBAbd:soft, non tender, + BS, do not palpate liver spleen or masses Ext:no lower ext edema, 2+ pedal pulses, 2+ radial pulses Neuro:alert and oriented, MAE, follows commands, + facial symmetry    EKG:  EKG is ordered today. The ekg ordered today demonstrates SR  with PACs.  No changes.    Recent Labs: No results found for requested labs within last 8760 hours.    Lipid Panel No results found for: CHOL, TRIG, HDL, CHOLHDL, VLDL, LDLCALC, LDLDIRECT     Other studies Reviewed: Additional studies/ records that were reviewed today include: carotid dopplers see above.    ASSESSMENT AND PLAN:  1.   Carotid stenosis without history of stroke: The patient needs carotid duplex scan. She continues on an excellent medical regimen with aspirin, a statin drug, and ACE inhibitor, and the beta blocker. She is educated on stroke/TIA symptoms. She will follow-up in one year with Dr. Excell Seltzerooper, her  PCP has retired she will try to see Dr. Andrey CampanileWilson. .  2. Essential hypertension: Blood pressure is controlled though today mildly elevated.  She states she has white coat syndrome and it is usually better controlled.  Current medical therapy reviewed today.  3. Hyperlipidemia: The patient is treated with atorvastatin. She will have lipids drawn through PCP.  Current medicines are reviewed with the patient today.  The patient Has no concerns regarding medicines.  The following changes have been made:  See above Labs/ tests ordered today include:see above  Disposition:   FU:  see above  Signed, Nada BoozerLaura Jarad Barth, NP  03/22/2016 9:54 AM    [facility] [address], [place], [place]  [address]/ [address] [place], [place] Phone: [phone]; Fax: [phone]  [phone]

## 2016-03-22 ENCOUNTER — Ambulatory Visit (INDEPENDENT_AMBULATORY_CARE_PROVIDER_SITE_OTHER): Payer: Medicare HMO | Admitting: Cardiology

## 2016-03-22 ENCOUNTER — Encounter: Payer: Self-pay | Admitting: Cardiology

## 2016-03-22 ENCOUNTER — Encounter (INDEPENDENT_AMBULATORY_CARE_PROVIDER_SITE_OTHER): Payer: Self-pay

## 2016-03-22 VITALS — BP 150/78 | HR 70 | Ht 63.0 in | Wt 170.0 lb

## 2016-03-22 DIAGNOSIS — E782 Mixed hyperlipidemia: Secondary | ICD-10-CM

## 2016-03-22 DIAGNOSIS — I6529 Occlusion and stenosis of unspecified carotid artery: Secondary | ICD-10-CM | POA: Diagnosis not present

## 2016-03-22 DIAGNOSIS — I10 Essential (primary) hypertension: Secondary | ICD-10-CM | POA: Diagnosis not present

## 2016-03-22 DIAGNOSIS — I6523 Occlusion and stenosis of bilateral carotid arteries: Secondary | ICD-10-CM | POA: Diagnosis not present

## 2016-03-22 NOTE — Patient Instructions (Signed)
  Medication Instructions:  Same-no changes  Labwork: None  Testing/Procedures: Your physician has requested that you have a carotid duplex. This test is an ultrasound of the carotid arteries in your neck. It looks at blood flow through these arteries that supply the brain with blood. Allow one hour for this exam. There are no restrictions or special instructions.   Follow-Up: Your physician wants you to follow-up in: 1 year with Dr Theodoro Parmaooper You will receive a reminder letter in the mail two months in advance. If you don't receive a letter, please call our office to schedule the follow-up appointment.     If you need a refill on your cardiac medications before your next appointment, please call your pharmacy.

## 2016-04-12 ENCOUNTER — Ambulatory Visit (HOSPITAL_COMMUNITY)
Admission: RE | Admit: 2016-04-12 | Discharge: 2016-04-12 | Disposition: A | Discharge status: Home / Self Care | Payer: Medicare HMO | Source: Ambulatory Visit | Attending: Cardiovascular Disease | Admitting: Cardiovascular Disease

## 2016-04-12 DIAGNOSIS — I6523 Occlusion and stenosis of bilateral carotid arteries: Secondary | ICD-10-CM | POA: Insufficient documentation

## 2016-04-12 DIAGNOSIS — I6529 Occlusion and stenosis of unspecified carotid artery: Secondary | ICD-10-CM | POA: Diagnosis present

## 2016-04-22 ENCOUNTER — Encounter: Payer: Self-pay | Admitting: *Deleted

## 2016-04-25 ENCOUNTER — Telehealth: Payer: Self-pay | Admitting: Cardiology

## 2016-04-25 NOTE — Telephone Encounter (Signed)
New message    Pt verbalized that she is returning call for the test results of the CAROTID

## 2016-04-25 NOTE — Telephone Encounter (Signed)
Pt received my letter for her to call the office for her Vas US Carotid results.  She has been made aware and she verbalized understanding.

## 2016-11-20 ENCOUNTER — Encounter: Payer: Self-pay | Admitting: Gastroenterology

## 2017-04-03 ENCOUNTER — Ambulatory Visit: Payer: Medicare HMO | Admitting: Cardiovascular Disease

## 2017-04-03 ENCOUNTER — Encounter: Payer: Self-pay | Admitting: Cardiovascular Disease

## 2017-04-03 VITALS — BP 142/80 | HR 66 | Ht 63.0 in | Wt 171.4 lb

## 2017-04-03 DIAGNOSIS — I10 Essential (primary) hypertension: Secondary | ICD-10-CM

## 2017-04-03 DIAGNOSIS — I6523 Occlusion and stenosis of bilateral carotid arteries: Secondary | ICD-10-CM | POA: Diagnosis not present

## 2017-04-03 DIAGNOSIS — E785 Hyperlipidemia, unspecified: Secondary | ICD-10-CM | POA: Diagnosis not present

## 2017-04-03 NOTE — Progress Notes (Signed)
Cardiology Office Note Date:  04/03/2017   ID:  Joanne Montes, DOB May 08, 1941, MRN [id number]  PCP:  Barbie BannerWilson, Fred H, MD  Cardiologist:  Tonny Bollmanooper, Yared Susan, MD    Chief Complaint  Patient presents with  . Follow-up     History of Present Illness: Joanne Montes is a 76 y.o. female who presents for follow-up of HTN and carotid stenosis. She has no hx of stroke or TIA.   She's here with her husband today. She feels well and has no complaints. Today, she denies symptoms of palpitations, chest pain, shortness of breath, orthopnea, PND, lower extremity edema, dizziness, or syncope.    Past Medical History:  Diagnosis Date  . Carotid stenosis   . Essential hypertension, benign   . Osteoarthrosis, unspecified whether generalized or localized, unspecified site   . Other and unspecified hyperlipidemia     No past surgical history on file.  Current Outpatient Medications  Medication Sig Dispense Refill  . albuterol (PROVENTIL,VENTOLIN) 90 MCG/ACT inhaler Inhale 2 puffs every 6 (six) hours as needed into the lungs for wheezing or shortness of breath.     Marland Kitchen. amLODipine (NORVASC) 2.5 MG tablet Take 2.5 mg daily by mouth.    Marland Kitchen. aspirin 81 MG tablet Take 81 mg by mouth daily.      Marland Kitchen. atorvastatin (LIPITOR) 20 MG tablet Take 20 mg by mouth daily.    . Calcium Carbonate-Vitamin D 600-400 MG-UNIT per tablet Take 1 tablet by mouth 2 (two) times daily.      . fish oil-omega-3 fatty acids 1000 MG capsule Take 2 g by mouth daily.    Marland Kitchen. glucosamine-chondroitin 500-400 MG tablet Take 1 tablet by mouth 2 (two) times daily.    . hydrochlorothiazide (HYDRODIURIL) 25 MG tablet Take 25 mg by mouth daily.     Marland Kitchen. ibuprofen (ADVIL,MOTRIN) 200 MG tablet Take 200 mg by mouth every 6 (six) hours as needed (for pain).     . Melatonin 5 MG CAPS Take 5 mg by mouth daily.     . metoprolol succinate (TOPROL-XL) 50 MG 24 hr tablet Take 100 mg 2 (two) times daily at 8 am and 10 pm by mouth.     . Multiple Vitamin  (MULTIVITAMIN) tablet Take 1 tablet by mouth daily.     No current facility-administered medications for this visit.     Allergies:   Guaifenesin and Z-pak [azithromycin]   Social History:  The patient  reports that she has quit smoking. she has never used smokeless tobacco. She reports that she does not drink alcohol or use drugs.   Family History:  The patient's family history includes Cancer in her father; Heart failure in her mother; Hypertension in her other.    ROS:  Please see the history of present illness.   All other systems are reviewed and negative.    PHYSICAL EXAM: VS:  BP (!) 142/80   Pulse 66   Ht 5\' 3"  (1.6 m)   Wt 171 lb 6.4 oz (77.7 kg)   BMI 30.36 kg/m  , BMI Body mass index is 30.36 kg/m. GEN: Well nourished, well developed, in no acute distress  HEENT: normal  Neck: no JVD, no masses. No carotid bruits Cardiac: RRR without murmur or gallop     Respiratory:  clear to auscultation bilaterally, normal work of breathing GI: soft, nontender, nondistended, + BS MS: no deformity or atrophy  Ext: no pretibial edema, pedal pulses 2+= bilaterally Skin: warm and dry, no rash Neuro:  Strength and sensation are intact Psych: euthymic mood, full affect  EKG:  EKG is ordered today. The ekg ordered today shows unusual P axis possible ectopic atrial rhythm, heart rate 66 bpm.  Recent Labs: No results found for requested labs within last 8760 hours.   Lipid Panel  No results found for: CHOL, TRIG, HDL, CHOLHDL, VLDL, LDLCALC, LDLDIRECT    Wt Readings from Last 3 Encounters:  04/03/17 171 lb 6.4 oz (77.7 kg)  03/22/16 170 lb (77.1 kg)  03/22/15 169 lb (76.7 kg)     Cardiac Studies Reviewed: Carotid Duplex 04-12-2016: Impressions Duplex imaging, with color Doppler, of the carotid arteries reveals serpentine proximal CCA's and distal ICA's. ICA velocities are elevated and stable, bilaterally.  The vertebral arteries are patent with antegrade flow,  bilaterally. The subclavian arteries are widely patent, with normal velocity flow. There is a echogenic, avascular structures in the both lobes of the thyroid. Technologist Notes Plaque Plaque Examination Data cm/s cm/s Stable 60-79% RICA stenosis, and 40-59% LICA stenosis. Patent vertebral arteries with antegrade flow. Normal subclavian arteries, bilaterally. Abnormal thyroid appearance, as described above. f/u 1 year  ASSESSMENT AND PLAN: 1.  Carotid stenosis without hx of stroke: moderate carotid disease bilaterally. Needs FU duplex. I can't appreciate a bruit on her exam. Continue ASA, statin, antihypertensives.   2.  Hypertension: Patient with a history of whitecoat hypertension.  Blood pressure minimally elevated today.  She will continue on hydrochlorothiazide, lisinopril  3.  Hyperlipidemia: Treated with atorvastatin 20 mg.  I reviewed her most recent lipids.  Compared to labs just 6 months ago her lipids are improved with cholesterol of 149, down from 189.  LDL has gone from 95 down to 70.  HDL is 56.  Overall Joanne Montes is stable.  She was counseled regarding stroke symptoms and use of 911 if any of these occur.  She will continue on her current medical program.  Carotid duplex is ordered today.  I will see her back in 1 year.  Current medicines are reviewed with the patient today.  The patient does not have concerns regarding medicines.  Labs/ tests ordered today include:  Orders Placed This Encounter  Procedures  . EKG 12-Lead    Disposition:   FU one year  Signed, Tonny Bollmanooper, Neel Buffone, MD  04/03/2017 2:26 PM    [facility] [address], [place], [place]  [address] Phone: [phone]; Fax: [phone]

## 2017-04-03 NOTE — Patient Instructions (Signed)
Medication Instructions:  Your provider recommends that you continue on your current medications as directed. Please refer to the Current Medication list given to you today.    Labwork: None  Testing/Procedures: Your physician has requested that you have a carotid duplex. This test is an ultrasound of the carotid arteries in your neck. It looks at blood flow through these arteries that supply the brain with blood. Allow one hour for this exam. There are no restrictions or special instructions.  Follow-Up: Your provider wants you to follow-up in: 1 year with Dr. Cooper. You will receive a reminder letter in the mail two months in advance. If you don't receive a letter, please call our office to schedule the follow-up appointment.    Any Other Special Instructions Will Be Listed Below (If Applicable).     If you need a refill on your cardiac medications before your next appointment, please call your pharmacy.   

## 2017-04-15 ENCOUNTER — Ambulatory Visit (HOSPITAL_COMMUNITY)
Admission: RE | Admit: 2017-04-15 | Discharge: 2017-04-15 | Disposition: A | Discharge status: Home / Self Care | Payer: Medicare HMO | Source: Ambulatory Visit | Attending: Cardiology | Admitting: Cardiology

## 2017-04-15 DIAGNOSIS — I6523 Occlusion and stenosis of bilateral carotid arteries: Secondary | ICD-10-CM | POA: Diagnosis present

## 2019-03-10 ENCOUNTER — Telehealth: Payer: Self-pay

## 2019-03-10 NOTE — Telephone Encounter (Signed)
Called pt to set up evisit with SW 03/12/2019, Pt declined.

## 2019-07-23 ENCOUNTER — Ambulatory Visit: Payer: Medicare HMO | Attending: Internal Medicine

## 2019-07-23 DIAGNOSIS — Z23 Encounter for immunization: Secondary | ICD-10-CM | POA: Insufficient documentation

## 2019-07-23 NOTE — Progress Notes (Signed)
   Covid-19 Vaccination Clinic  Name:  Joanne Montes    MRN: [id number] DOB: Oct 23, 1940  07/23/2019  Joanne Montes was observed post Covid-19 immunization for 15 minutes without incidence. She was provided with Vaccine Information Sheet and instruction to access the V-Safe system.   Joanne Montes was instructed to call 911 with any severe reactions post vaccine: Marland Kitchen Difficulty breathing  . Swelling of your face and throat  . A fast heartbeat  . A bad rash all over your body  . Dizziness and weakness    Immunizations Administered    Name Date Dose VIS Date Route   Pfizer COVID-19 Vaccine 07/23/2019  1:58 AM 0.3 mL 05/07/2019 Intramuscular   Manufacturer: [employer], [employer]   Lot: [id number]   NDC: [address]-3

## 2019-08-18 ENCOUNTER — Ambulatory Visit: Payer: Medicare HMO | Attending: Internal Medicine

## 2019-08-18 DIAGNOSIS — Z23 Encounter for immunization: Secondary | ICD-10-CM

## 2019-08-18 NOTE — Progress Notes (Signed)
   Covid-19 Vaccination Clinic  Name:  Joanne Montes    MRN: [id number] DOB: 08-Jun-1940  08/18/2019  [name] was observed post Covid-19 immunization for 15 minutes without incident. She was provided with Vaccine Information Sheet and instruction to access the V-Safe system.   [name] was instructed to call 911 with any severe reactions post vaccine: Marland Kitchen Difficulty breathing  . Swelling of face and throat  . A fast heartbeat  . A bad rash all over body  . Dizziness and weakness   Immunizations Administered    Name Date Dose VIS Date Route   Pfizer COVID-19 Vaccine 08/18/2019  1:54 PM 0.3 mL 05/07/2019 Intramuscular   Manufacturer: [employer], [employer]   Lot: [id number]   NDC: [address]-8

## 2020-04-14 ENCOUNTER — Other Ambulatory Visit: Payer: Self-pay | Admitting: Physician Assistant

## 2020-04-14 DIAGNOSIS — E2839 Other primary ovarian failure: Secondary | ICD-10-CM

## 2020-07-26 ENCOUNTER — Other Ambulatory Visit: Payer: Medicare HMO

## 2020-07-26 ENCOUNTER — Ambulatory Visit
Admission: RE | Admit: 2020-07-26 | Discharge: 2020-07-26 | Disposition: A | Discharge status: Home / Self Care | Payer: Medicare HMO | Source: Ambulatory Visit | Attending: Physician Assistant | Admitting: Physician Assistant

## 2020-07-26 ENCOUNTER — Other Ambulatory Visit: Payer: Self-pay

## 2020-07-26 DIAGNOSIS — E2839 Other primary ovarian failure: Secondary | ICD-10-CM

## 2022-12-18 ENCOUNTER — Ambulatory Visit: Payer: Medicare HMO | Attending: Cardiovascular Disease | Admitting: Cardiovascular Disease

## 2022-12-18 ENCOUNTER — Ambulatory Visit (INDEPENDENT_AMBULATORY_CARE_PROVIDER_SITE_OTHER): Payer: Medicare HMO

## 2022-12-18 ENCOUNTER — Encounter: Payer: Self-pay | Admitting: Cardiovascular Disease

## 2022-12-18 VITALS — BP 149/70 | HR 83 | Ht 60.0 in | Wt 164.4 lb

## 2022-12-18 DIAGNOSIS — I491 Atrial premature depolarization: Secondary | ICD-10-CM | POA: Diagnosis not present

## 2022-12-18 DIAGNOSIS — I6529 Occlusion and stenosis of unspecified carotid artery: Secondary | ICD-10-CM

## 2022-12-18 DIAGNOSIS — I10 Essential (primary) hypertension: Secondary | ICD-10-CM

## 2022-12-18 DIAGNOSIS — I493 Ventricular premature depolarization: Secondary | ICD-10-CM | POA: Diagnosis not present

## 2022-12-18 DIAGNOSIS — I6523 Occlusion and stenosis of bilateral carotid arteries: Secondary | ICD-10-CM | POA: Diagnosis not present

## 2022-12-18 MED ORDER — SPIRONOLACTONE 25 MG PO TABS: 12.5000 mg | ORAL_TABLET | Freq: Every day | ORAL | 3 refills | Status: DC

## 2022-12-18 NOTE — Patient Instructions (Signed)
Medication Instructions:  Spironolactone 12.5 mg every morning *If you need a refill on your cardiac medications before your next appointment, please call your pharmacy*  Testing/Procedures: Your physician has requested that you have a carotid duplex. This test is an ultrasound of the carotid arteries in your neck. It looks at blood flow through these arteries that supply the brain with blood. Allow one hour for this exam. There are no restrictions or special instructions.   Your physician has recommended that you wear a 3 DAY ZIO-PATCH monitor. The Zio patch cardiac monitor continuously records heart rhythm data for up to 14 days, this is for patients being evaluated for multiple types heart rhythms. For the first 24 hours post application, please avoid getting the Zio monitor wet in the shower or by excessive sweating during exercise. After that, feel free to carry on with regular activities. Keep soaps and lotions away from the ZIO XT Patch.  This will be mailed to you, please expect 7-10 days to receive.    Applying the monitor   Shave hair from upper left chest.   Hold abrader disc by orange tab.  Rub abrader in 40 strokes over left upper chest as indicated in your monitor instructions.   Clean area with 4 enclosed alcohol pads .  Use all pads to assure are is cleaned thoroughly.  Let dry.   Apply patch as indicated in monitor instructions.  Patch will be place under collarbone on left side of chest with arrow pointing upward.   Rub patch adhesive wings for 2 minutes.Remove white label marked "1".  Remove white label marked "2".  Rub patch adhesive wings for 2 additional minutes.   While looking in a mirror, press and release button in center of patch.  A small green light will flash 3-4 times .  This will be your only indicator the monitor has been turned on.     Do not shower for the first 24 hours.  You may shower after the first 24 hours.   Press button if you feel a symptom. You  will hear a small click.  Record Date, Time and Symptom in the Patient Log Book.   When you are ready to remove patch, follow instructions on last 2 pages of Patient Log Book.  Stick patch monitor onto last page of Patient Log Book.   Place Patient Log Book in [place] box.  Use locking tab on box and tape box closed securely.  The [employer] and [employer] has [employer] on it.  Please place in mailbox as soon as possible.  Your physician should have your test results approximately 7 days after the monitor has been mailed back to [facility]   Call [facility] Customer Care at [phone] if you have questions regarding your ZIO XT patch monitor.  Call them immediately if you see an orange light blinking on your monitor.   If your monitor falls off in less than 4 days contact our Monitor department at [phone].  If your monitor becomes loose or falls off after 4 days call [employer] at [phone] for suggestions on securing your monitor    Follow-Up: At [facility], you and your health needs are our priority.  As part of our continuing mission to provide you with exceptional heart care, we have created designated Provider Care Teams.  These Care Teams include your primary Cardiologist (physician) and Advanced Practice Providers (APPs -  Physician Assistants and Nurse Practitioners) who all work together to provide you  with the care you need, when you need it.  We recommend signing up for the patient portal called "MyChart".  Sign up information is provided on this After Visit Summary.  MyChart is used to connect with patients for Virtual Visits (Telemedicine).  Patients are able to view lab/test results, encounter notes, upcoming appointments, etc.  Non-urgent messages can be sent to your provider as well.   To learn more about what you can do with MyChart, go to [email or web address].    Your next appointment:    Follow up as needed  Provider:   Dr Royann Shivers  Keep a BP  log for 2 weeks and sent in by MyChart.

## 2022-12-18 NOTE — Progress Notes (Signed)
Cardiology Office Note:  .   Date:  12/22/2022  ID:  Joanne Montes, DOB 12-12-1940, MRN [id number] PCP: Barbie Banner, MD  [facility] Providers Cardiologist:  None    History of Present Illness: Marland Kitchen   Joanne Montes is a 82 y.o. female who is being seen today for the evaluation of bradycardia at the request of Andrey Campanile, Gloriajean Dell, MD.   Joanne Montes is a 81 y.o. female with a history of hypertension and hyperlipidemia who was noted to have bradycardia when vital signs were taken at her recent office visit with a heart rate in the 40s however subsequent ECG showed a heart rate of 76 with frequent PACs and PVCs.  She does complain of some occasional dizziness and intermittent lightheadedness but overall feels well.  She denies chest pain or shortness of breath at rest or with activity, edema, palpitations, syncope, headaches, focal neurological complaints or falls.  Duplex ultrasonography was performed in November 2018 showed a 60 to 79% right internal carotid artery stenosis and a 40-59% left internal carotid artery stenosis.  A follow-up ultrasound was performed in 2018 showed less than 40% stenosis bilaterally but the external carotid artery was felt to have more than 50% stenosis on the right side.  ROS: No symptoms of TIA/CVA.  Studies Reviewed: Marland Kitchen   EKG Interpretation Date/Time:  Wednesday December 18 2022 14:51:37 EDT Ventricular Rate:  83 PR Interval:  146 QRS Duration:  94 QT Interval:  414 QTC Calculation: 486 R Axis:   -35  Text Interpretation: Sinus rhythm with premature atrial contractions and premature ventricular contractions Left axis deviation Incomplete right bundle branch block QTcB >= 480 msec No previous ECGs available Confirmed by Ryleigh Esqueda ([id number]) on 12/18/2022 3:04:00 PM    Labs from 04/25/2022 showed cholesterol 164, HDL 48, triglycerides 250, hemoglobin A1c 6.2% Labs from 10/23/2022 showed total cholesterol 180, HDL 55, triglycerides 205,  calculated LDL 95 Labs from 12/09/2022 showed hemoglobin 15.3 TSH 0.661, glucose 120, potassium 3.6, creatinine 0.69, normal liver function tests Risk Assessment/Calculations:          Physical Exam:   VS:  BP (!) 149/70   Pulse 83   Ht 5' (1.524 m)   Wt 164 lb 6.4 oz (74.6 kg)   SpO2 90%   BMI 32.11 kg/m    Wt Readings from Last 3 Encounters:  12/18/22 164 lb 6.4 oz (74.6 kg)  04/03/17 171 lb 6.4 oz (77.7 kg)  03/22/16 170 lb (77.1 kg)    GEN: Well nourished, well developed in no acute distress NECK: No JVD; she has bilateral carotid bruits CARDIAC: Frequent ectopy on a background of RRR, no murmurs, rubs, gallops RESPIRATORY:  Clear to auscultation without rales, wheezing or rhonchi  ABDOMEN: Soft, non-tender, non-distended EXTREMITIES:  No edema; No deformity   ASSESSMENT AND PLAN: .   Bradycardia/PAC/PVCs: Suspect that this is artifactual due to the method of recording her vital signs.  We discussed the difference between the peripheral pulse (as can be documented by pulse oximetry, blood pressure cuff, digital palpation of a peripheral pulse, etc.) and the central heart rate (measured by auscultation of the heart or an electrical tracing).  I strongly suspect that she had a "under counted" heart rates due to the frequent ectopic beats.  Will have her wear a monitor to confirm the suspicion and also to get a better idea of the overall burden of ectopy and whether or not she has more complex arrhythmia.  She  does not need a pacemaker. HTN: Her blood pressure is not well-controlled.  At home she states that her typical blood pressures in the 150s/80s.  She is on hydrochlorothiazide, amlodipine and metoprolol.  The latter is already on a fairly high dose.  I wonder whether the hydrochlorothiazide may be promoting arrhythmia via electrolyte imbalances.  Will add spironolactone and have her send [place] a blood pressure log in a couple of weeks. HLP: She does not have known CAD or PAD.  The  current LDL cholesterol level is acceptable.  Continue statin. Carotid stenosis: Check duplex ultrasound.  For many years her carotid ultrasound was felt to show significant stenoses but then in 2018 it did not.  It is reasonable to recheck 6 years later.       Dispo: Start spironolactone 25 mg once daily.  Send blood pressure log in 2 weeks.  Check arrhythmia monitor.  Check duplex carotid ultrasound.  Signed, Thurmon Fair, MD

## 2022-12-18 NOTE — Progress Notes (Unsigned)
Enrolled patient for a 3 day Zio XT monitor to be mailed to patients home  

## 2022-12-21 DIAGNOSIS — I491 Atrial premature depolarization: Secondary | ICD-10-CM

## 2022-12-21 DIAGNOSIS — I493 Ventricular premature depolarization: Secondary | ICD-10-CM

## 2022-12-22 ENCOUNTER — Encounter: Payer: Self-pay | Admitting: Cardiovascular Disease

## 2022-12-23 ENCOUNTER — Ambulatory Visit (HOSPITAL_COMMUNITY)
Admission: RE | Admit: 2022-12-23 | Discharge: 2022-12-23 | Disposition: A | Discharge status: Home / Self Care | Payer: Medicare HMO | Source: Ambulatory Visit | Attending: Cardiovascular Disease | Admitting: Cardiovascular Disease

## 2022-12-23 DIAGNOSIS — I6523 Occlusion and stenosis of bilateral carotid arteries: Secondary | ICD-10-CM | POA: Insufficient documentation

## 2022-12-27 ENCOUNTER — Encounter: Payer: Self-pay | Admitting: Emergency Medicine

## 2022-12-27 ENCOUNTER — Telehealth: Payer: Self-pay | Admitting: Emergency Medicine

## 2022-12-27 DIAGNOSIS — I491 Atrial premature depolarization: Secondary | ICD-10-CM

## 2022-12-27 NOTE — Telephone Encounter (Signed)
Called to go over results from monitor (see below)with this patient, no answer, no voicemail. Echocardiogram ordered  Croitoru, Mihai, MD  Scheryl Marten, RN The monitor showed extremely frequent "skipped" beats from the top chamber the heart (premature atrial contractions or PACs).  There was no evidence of atrial fibrillation.  There was no evidence of severe slow heartbeats.  The slowest heart rate recorded was 48 bpm, which is physiological during sleep. 30% or more of all the beats were premature (PACs).  As we discussed in clinic, PACs may create such a weak pulse that it will not be picked up by a pulse oximeter or automatic blood pressure cuff or the photo-sensor of a smart watch..  Therefore, these devices may report an erroneous heart rate that is actually 30% slower than the true heart rate. If there is concern with the heart rate being too slow, this should be confirmed with an electrical tracing of the heart. The frequency of the premature atrial contractions is  very high.  While the premature contractions are not dangerous and do not themselves, there is sheer frequency raises the concern that they may some underlying heart problem that is triggering them.    I would recommend an echocardiogram.  Katie, can you please schedule that?

## 2022-12-27 NOTE — Telephone Encounter (Signed)
Mailed results to patient. Will send a MyChart message.

## 2023-01-16 ENCOUNTER — Ambulatory Visit (HOSPITAL_COMMUNITY): Payer: Medicare HMO | Attending: Cardiovascular Disease

## 2023-01-16 DIAGNOSIS — E785 Hyperlipidemia, unspecified: Secondary | ICD-10-CM | POA: Diagnosis not present

## 2023-01-16 DIAGNOSIS — I119 Hypertensive heart disease without heart failure: Secondary | ICD-10-CM | POA: Diagnosis not present

## 2023-01-16 DIAGNOSIS — I10 Essential (primary) hypertension: Secondary | ICD-10-CM

## 2023-01-16 DIAGNOSIS — I491 Atrial premature depolarization: Secondary | ICD-10-CM

## 2023-01-16 LAB — ECHOCARDIOGRAM COMPLETE
Area-P 1/2: 1.94 cm2
S' Lateral: 2.8 cm

## 2023-02-11 ENCOUNTER — Encounter: Payer: Self-pay | Admitting: Cardiovascular Disease

## 2023-02-11 NOTE — Telephone Encounter (Signed)
Over the last couple of weeks those readings have been excellent.  Please continue the same medications.

## 2023-12-18 ENCOUNTER — Other Ambulatory Visit: Payer: Self-pay | Admitting: Cardiovascular Disease
